# Patient Record
Sex: Male | Born: 1985 | Race: White | Hispanic: No | Marital: Single | Smoking: Never smoker
Health system: Southern US, Community
[De-identification: ages and names within clinical notes are randomized; demographics above are authoritative.]

---
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
## 2005-12-18 ENCOUNTER — Emergency Department (HOSPITAL_COMMUNITY): Admission: EM | Admit: 2005-12-18 | Discharge: 2005-12-18 | Payer: Self-pay | Admitting: Family Medicine

## 2016-07-24 DIAGNOSIS — Z0000 Encounter for general adult medical examination without abnormal findings: Secondary | ICD-10-CM | POA: Diagnosis not present

## 2016-12-09 DIAGNOSIS — L649 Androgenic alopecia, unspecified: Secondary | ICD-10-CM | POA: Diagnosis not present

## 2017-02-19 DIAGNOSIS — L649 Androgenic alopecia, unspecified: Secondary | ICD-10-CM | POA: Diagnosis not present

## 2017-07-06 DIAGNOSIS — Z01818 Encounter for other preprocedural examination: Secondary | ICD-10-CM | POA: Diagnosis not present

## 2017-09-16 DIAGNOSIS — J310 Chronic rhinitis: Secondary | ICD-10-CM | POA: Diagnosis not present

## 2017-09-16 DIAGNOSIS — J343 Hypertrophy of nasal turbinates: Secondary | ICD-10-CM | POA: Diagnosis not present

## 2018-03-06 DIAGNOSIS — S43015A Anterior dislocation of left humerus, initial encounter: Secondary | ICD-10-CM | POA: Diagnosis not present

## 2018-03-06 DIAGNOSIS — M25519 Pain in unspecified shoulder: Secondary | ICD-10-CM | POA: Diagnosis not present

## 2018-03-06 DIAGNOSIS — R52 Pain, unspecified: Secondary | ICD-10-CM | POA: Diagnosis not present

## 2018-03-07 ENCOUNTER — Emergency Department (HOSPITAL_BASED_OUTPATIENT_CLINIC_OR_DEPARTMENT_OTHER): Payer: 59

## 2018-03-07 ENCOUNTER — Encounter (HOSPITAL_BASED_OUTPATIENT_CLINIC_OR_DEPARTMENT_OTHER): Payer: Self-pay | Admitting: *Deleted

## 2018-03-07 ENCOUNTER — Emergency Department (HOSPITAL_BASED_OUTPATIENT_CLINIC_OR_DEPARTMENT_OTHER)
Admission: EM | Admit: 2018-03-07 | Discharge: 2018-03-07 | Disposition: A | Discharge status: Home / Self Care | Payer: 59 | Attending: Emergency Medicine | Admitting: Emergency Medicine

## 2018-03-07 ENCOUNTER — Other Ambulatory Visit: Payer: Self-pay

## 2018-03-07 DIAGNOSIS — W1839XA Other fall on same level, initial encounter: Secondary | ICD-10-CM | POA: Diagnosis not present

## 2018-03-07 DIAGNOSIS — S43015A Anterior dislocation of left humerus, initial encounter: Secondary | ICD-10-CM | POA: Insufficient documentation

## 2018-03-07 DIAGNOSIS — Y999 Unspecified external cause status: Secondary | ICD-10-CM | POA: Diagnosis not present

## 2018-03-07 DIAGNOSIS — Y929 Unspecified place or not applicable: Secondary | ICD-10-CM | POA: Diagnosis not present

## 2018-03-07 DIAGNOSIS — S43005A Unspecified dislocation of left shoulder joint, initial encounter: Secondary | ICD-10-CM | POA: Diagnosis not present

## 2018-03-07 DIAGNOSIS — Y9322 Activity, ice hockey: Secondary | ICD-10-CM | POA: Insufficient documentation

## 2018-03-07 DIAGNOSIS — S4992XA Unspecified injury of left shoulder and upper arm, initial encounter: Secondary | ICD-10-CM | POA: Diagnosis present

## 2018-03-07 DIAGNOSIS — S4292XA Fracture of left shoulder girdle, part unspecified, initial encounter for closed fracture: Secondary | ICD-10-CM | POA: Insufficient documentation

## 2018-03-07 DIAGNOSIS — T148XXA Other injury of unspecified body region, initial encounter: Secondary | ICD-10-CM

## 2018-03-07 MED ORDER — KETAMINE HCL 10 MG/ML IJ SOLN
60.0000 mg | Freq: Once | INTRAMUSCULAR | Status: AC
  Administered 2018-03-07: 40 mg via INTRAVENOUS
  Filled 2018-03-07: qty 1

## 2018-03-07 MED ORDER — PROPOFOL 10 MG/ML IV BOLUS
INTRAVENOUS | Status: AC | PRN
  Administered 2018-03-07: 20 mg via INTRAVENOUS

## 2018-03-07 MED ORDER — PROPOFOL 10 MG/ML IV BOLUS
40.0000 mg | Freq: Once | INTRAVENOUS | Status: AC
Start: 2018-03-07 — End: 2018-03-07
  Administered 2018-03-07: 40 mg via INTRAVENOUS
  Filled 2018-03-07: qty 20

## 2018-03-07 MED ORDER — ONDANSETRON HCL 4 MG/2ML IJ SOLN
INTRAMUSCULAR | Status: AC
  Administered 2018-03-07: 02:00:00
  Filled 2018-03-07: qty 2

## 2018-03-07 MED ORDER — SODIUM CHLORIDE 0.9 % IV BOLUS
1000.0000 mL | Freq: Once | INTRAVENOUS | Status: AC
  Administered 2018-03-07: 1000 mL via INTRAVENOUS

## 2018-03-07 MED ORDER — HYDROMORPHONE HCL 1 MG/ML IJ SOLN
1.0000 mg | Freq: Once | INTRAMUSCULAR | Status: AC
  Administered 2018-03-07: 1 mg via INTRAVENOUS
  Filled 2018-03-07: qty 1

## 2018-03-07 NOTE — ED Triage Notes (Addendum)
Pt states that he fell while playing hockey and landed on his left shoulder. History of left shoulder dislocation last week that he was able to reduce himself. Pt arrived via [facility]. Denies any other injury. Pt rates pain 8/10. Pt received of fentanyl and 4mg  zofran. Pt unable to move his left shoulder due to pain. Ice pack given.

## 2018-03-07 NOTE — ED Notes (Signed)
Patient transported to X-ray 

## 2018-03-07 NOTE — Sedation Documentation (Signed)
Pt continues to moan despite medication. V/O given by Dr. Eudelia Bunch for 20 mg of Propofol.

## 2018-03-07 NOTE — ED Provider Notes (Addendum)
[facility] EMERGENCY DEPARTMENT Provider Note  CSN: [id number] Arrival date & time: 03/07/18 9562  Chief Complaint(s) left shoulder injury  HPI Kevin Booker is a 32 y.o. male who presents to the emergency department with left shoulder pain.  He reports that he was playing hockey several hours ago and fell onto his left shoulder.  Patient felt immediate pain that was exacerbated with range of motion of the shoulder.  Alleviated by immobility.  States that this feels like it is dislocated shoulder similar to one he had last week which she self reduced.  Reports 2 prior dislocations several years ago.  Denies any other trauma or injuries.  Denies any numbness or tingling.  No neck pain or back pain.  No chest pain, abdominal pain.  No hip pain or lower extremity pain.  HPI  Past Medical History History reviewed. No pertinent past medical history. There are no active problems to display for this patient.  Home Medication(s) Prior to Admission medications   Medication Sig Start Date End Date Taking? Authorizing Provider  Venlafaxine HCl (EFFEXOR PO) Take by mouth.   Yes [provider]                                                                                                                                    Past Surgical History History reviewed. No pertinent surgical history. Family History History reviewed. No pertinent family history.  Social History Social History   Tobacco Use  . Smoking status: Never Smoker  . Smokeless tobacco: Never Used  Substance Use Topics  . Alcohol use: Not Currently    Frequency: Never  . Drug use: Never   Allergies Sulfa antibiotics  Review of Systems Review of Systems All other systems are reviewed and are negative for acute change except as noted in the HPI  Physical Exam Vital Signs  I have reviewed the triage vital signs BP 127/85 (BP Location: Right Arm)   Pulse 63   Temp 98.3 F (36.8 C)   Resp 17   SpO2  100%   Physical Exam  Constitutional: He is oriented to person, place, and time. He appears well-developed and well-nourished. No distress.  HENT:  Head: Normocephalic and atraumatic.  Right Ear: External ear normal.  Left Ear: External ear normal.  Nose: Nose normal.  Mouth/Throat: Mucous membranes are normal. No trismus in the jaw.  Eyes: Conjunctivae and EOM are normal. No scleral icterus.  Neck: Normal range of motion and phonation normal. No spinous process tenderness and no muscular tenderness present. Normal range of motion present.  Cardiovascular: Normal rate and regular rhythm.  Pulmonary/Chest: Effort normal. No stridor. No respiratory distress.  Abdominal: He exhibits no distension.  Musculoskeletal: He exhibits no edema.       Left shoulder: He exhibits decreased range of motion, tenderness and deformity. He exhibits normal pulse and normal strength.  Neurological: He is alert and oriented to  person, place, and time.  Skin: He is not diaphoretic.  Psychiatric: He has a normal mood and affect. His behavior is normal.  Vitals reviewed.   ED Results and Treatments Labs (all labs ordered are listed, but only abnormal results are displayed) Labs Reviewed - No data to display                                                                                                                       EKG  EKG Interpretation  Date/Time:    Ventricular Rate:    PR Interval:    QRS Duration:   QT Interval:    QTC Calculation:   R Axis:     Text Interpretation:        Radiology Dg Shoulder Left  Result Date: 03/07/2018 CLINICAL DATA:  32 year old male with fall and left shoulder dislocation. EXAM: LEFT SHOULDER - 2+ VIEW COMPARISON:  None. FINDINGS: There is anterior dislocation of the left shoulder. A 5 mm radiodense focus medial to the humeral head may be artifactual or represent a tiny bone fragment. The soft tissues are unremarkable. IMPRESSION: Anterior dislocation of the  left shoulder. Electronically Signed   By: Elgie Collard M.D.   On: 03/07/2018 01:53   Dg Shoulder Left Portable  Result Date: 03/07/2018 CLINICAL DATA:  Reduction of anterior dislocation EXAM: LEFT SHOULDER-2 VIEW COMPARISON:  March 07, 2018 study obtained earlier in the day FINDINGS: Frontal and Y scapular images were obtained. There has been successful reduction of anterior dislocation. Currently no dislocation evident. There is an old healed fracture of the left clavicle. A small calcification is noted inferior to the glenoid, likely a small avulsed bony fragment. No new evident fracture. IMPRESSION: Successful reduction of anterior dislocation. Small bony fragment located inferior to the glenoid, likely an avulsion of uncertain origin. Old healed fracture left clavicle with remodeling. Electronically Signed   By: Bretta Bang III M.D.   On: 03/07/2018 02:53   Pertinent labs & imaging results that were available during my care of the patient were reviewed by me and considered in my medical decision making (see chart for details).  Medications Ordered in ED Medications  ondansetron (ZOFRAN) 4 MG/2ML injection (has no administration in time range)  HYDROmorphone (DILAUDID) injection 1 mg (1 mg Intravenous Given 03/07/18 0135)  ketamine (KETALAR) injection 60 mg (40 mg Intravenous Given by Other 03/07/18 0228)  propofol (DIPRIVAN) 10 mg/mL bolus/IV push 40 mg (40 mg Intravenous Given by Other 03/07/18 0229)  sodium chloride 0.9 % bolus 1,000 mL (1,000 mLs Intravenous New Bag/Given 03/07/18 0135)  propofol (DIPRIVAN) 10 mg/mL bolus/IV push (20 mg Intravenous Given 03/07/18 0231)  Procedures .Sedation Date/Time: 03/07/2018 3:15 AM Performed by: Nira Conn, MD Authorized by: Nira Conn, MD   Consent:    Consent obtained:  Verbal and  written   Consent given by:  Patient   Risks discussed:  Allergic reaction, dysrhythmia, inadequate sedation, nausea, prolonged hypoxia resulting in organ damage, prolonged sedation necessitating reversal, respiratory compromise necessitating ventilatory assistance and intubation and vomiting   Alternatives discussed:  Analgesia without sedation, anxiolysis and regional anesthesia Universal protocol:    Procedure explained and questions answered to patient or proxy's satisfaction: yes     Relevant documents present and verified: yes     Test results available and properly labeled: yes     Imaging studies available: yes     Required blood products, implants, devices, and special equipment available: yes     Site/side marked: yes     Immediately prior to procedure a time out was called: yes     Patient identity confirmation method:  Verbally with patient Indications:    Procedure necessitating sedation performed by:  Physician performing sedation Pre-sedation assessment:    Time since last food or drink:  Last food was several hours. drank a sip of water less than 1 hour ago   ASA classification: class 1 - normal, healthy patient     Neck mobility: normal     Mouth opening:  3 or more finger widths   Thyromental distance:  4 finger widths   Mallampati score:  I - soft palate, uvula, fauces, pillars visible   Pre-sedation assessments completed and reviewed: airway patency, cardiovascular function, hydration status, mental status, nausea/vomiting, pain level, respiratory function and temperature     Pre-sedation assessment completed:  03/07/2018 2:00 AM Immediate pre-procedure details:    Reassessment: Patient reassessed immediately prior to procedure     Reviewed: vital signs, relevant labs/tests and NPO status     Verified: bag valve mask available, emergency equipment available, intubation equipment available, IV patency confirmed, oxygen available and suction available   Procedure details  (see MAR for exact dosages):    Preoxygenation:  Nasal cannula   Sedation:  Propofol   Intra-procedure monitoring:  Blood pressure monitoring, cardiac monitor, continuous pulse oximetry, frequent LOC assessments, frequent vital sign checks and continuous capnometry   Intra-procedure events: none     Total Provider sedation time (minutes):  5 Post-procedure details:    Post-sedation assessment completed:  03/07/2018 3:16 AM   Attendance: Constant attendance by certified staff until patient recovered     Recovery: Patient returned to pre-procedure baseline     Post-sedation assessments completed and reviewed: airway patency, cardiovascular function, hydration status, mental status, nausea/vomiting, pain level, respiratory function and temperature     Patient is stable for discharge or admission: yes     Patient tolerance:  Tolerated well, no immediate complications .Ortho Injury Treatment Date/Time: 03/07/2018 3:16 AM Performed by: Nira Conn, MD Authorized by: Nira Conn, MD   Consent:    Consent obtained:  Written   Consent given by:  Patient   Risks discussed:  Fracture, irreducible dislocation and recurrent dislocation   Alternatives discussed:  ImmobilizationInjury location: shoulder Location details: left shoulder Injury type: dislocation Dislocation type: anterior Hill-Sachs deformity: no Chronicity: recurrent Pre-procedure neurovascular assessment: neurovascularly intact  Patient sedated: Yes. Refer to sedation procedure documentation for details of sedation. Manipulation performed: yes Reduction method: external rotation Reduction successful: yes X-ray confirmed reduction: yes Immobilization: sling Post-procedure neurovascular assessment: post-procedure neurovascularly intact Patient tolerance: Patient tolerated the  procedure well with no immediate complications     (including critical care time)  Medical Decision Making / ED Course I have  reviewed the nursing notes for this encounter and the patient's prior records (if available in EHR or on provided paperwork).    Patient presents with left shoulder dislocation.  No other injuries noted on exam requiring imaging.  Neurovascular intact distally.  Shoulder dislocation was reduced successfully as above.  The patient appears reasonably screened and/or stabilized for discharge and I doubt any other medical condition or other [facility] requiring further screening, evaluation, or treatment in the ED at this time prior to discharge.  The patient is safe for discharge with strict return precautions.   Final Clinical Impression(s) / ED Diagnoses Final diagnoses:  Traumatic closed displaced fracture of left shoulder with anterior dislocation, initial encounter  Avulsion fracture    Disposition: Discharge  Condition: Good  I have discussed the results, Dx and Tx plan with the patient who expressed understanding and agree(s) with the plan. Discharge instructions discussed at great length. The patient was given strict return precautions who verbalized understanding of the instructions. No further questions at time of discharge.    ED Discharge Orders    None       Follow Up: Kathryne Hitch, MD [address] [place] [address] [phone]  Schedule an appointment as soon as possible for a visit  For close follow up to assess for shoulder dislocation with avulsion glenoid fracture     This chart was dictated using voice recognition software.  Despite best efforts to proofread,  errors can occur which can change the documentation meaning.     Nira Conn, MD 03/07/18 [phone]

## 2018-03-07 NOTE — ED Notes (Signed)
Sedation end. Pt stable, awake and talking, able to move all extremities.

## 2018-03-07 NOTE — Sedation Documentation (Signed)
Sedation end charted in error. Sedation monitoring continued. Pt awake and talking, procedure completed.

## 2018-06-16 DIAGNOSIS — L649 Androgenic alopecia, unspecified: Secondary | ICD-10-CM | POA: Diagnosis not present

## 2020-01-12 DIAGNOSIS — Z0000 Encounter for general adult medical examination without abnormal findings: Secondary | ICD-10-CM | POA: Diagnosis not present

## 2020-01-12 DIAGNOSIS — Z13220 Encounter for screening for lipoid disorders: Secondary | ICD-10-CM | POA: Diagnosis not present

## 2020-01-12 DIAGNOSIS — Z23 Encounter for immunization: Secondary | ICD-10-CM | POA: Diagnosis not present

## 2020-01-16 IMAGING — DX DG SHOULDER 1V*L*
2 series · 2 of 2 positions shown · non-contrast
Comparison: March 07, 2018 study obtained earlier in the day

CLINICAL DATA: Reduction of anterior dislocation

EXAM:
LEFT [id number] VIEW

[shoulder grashey]
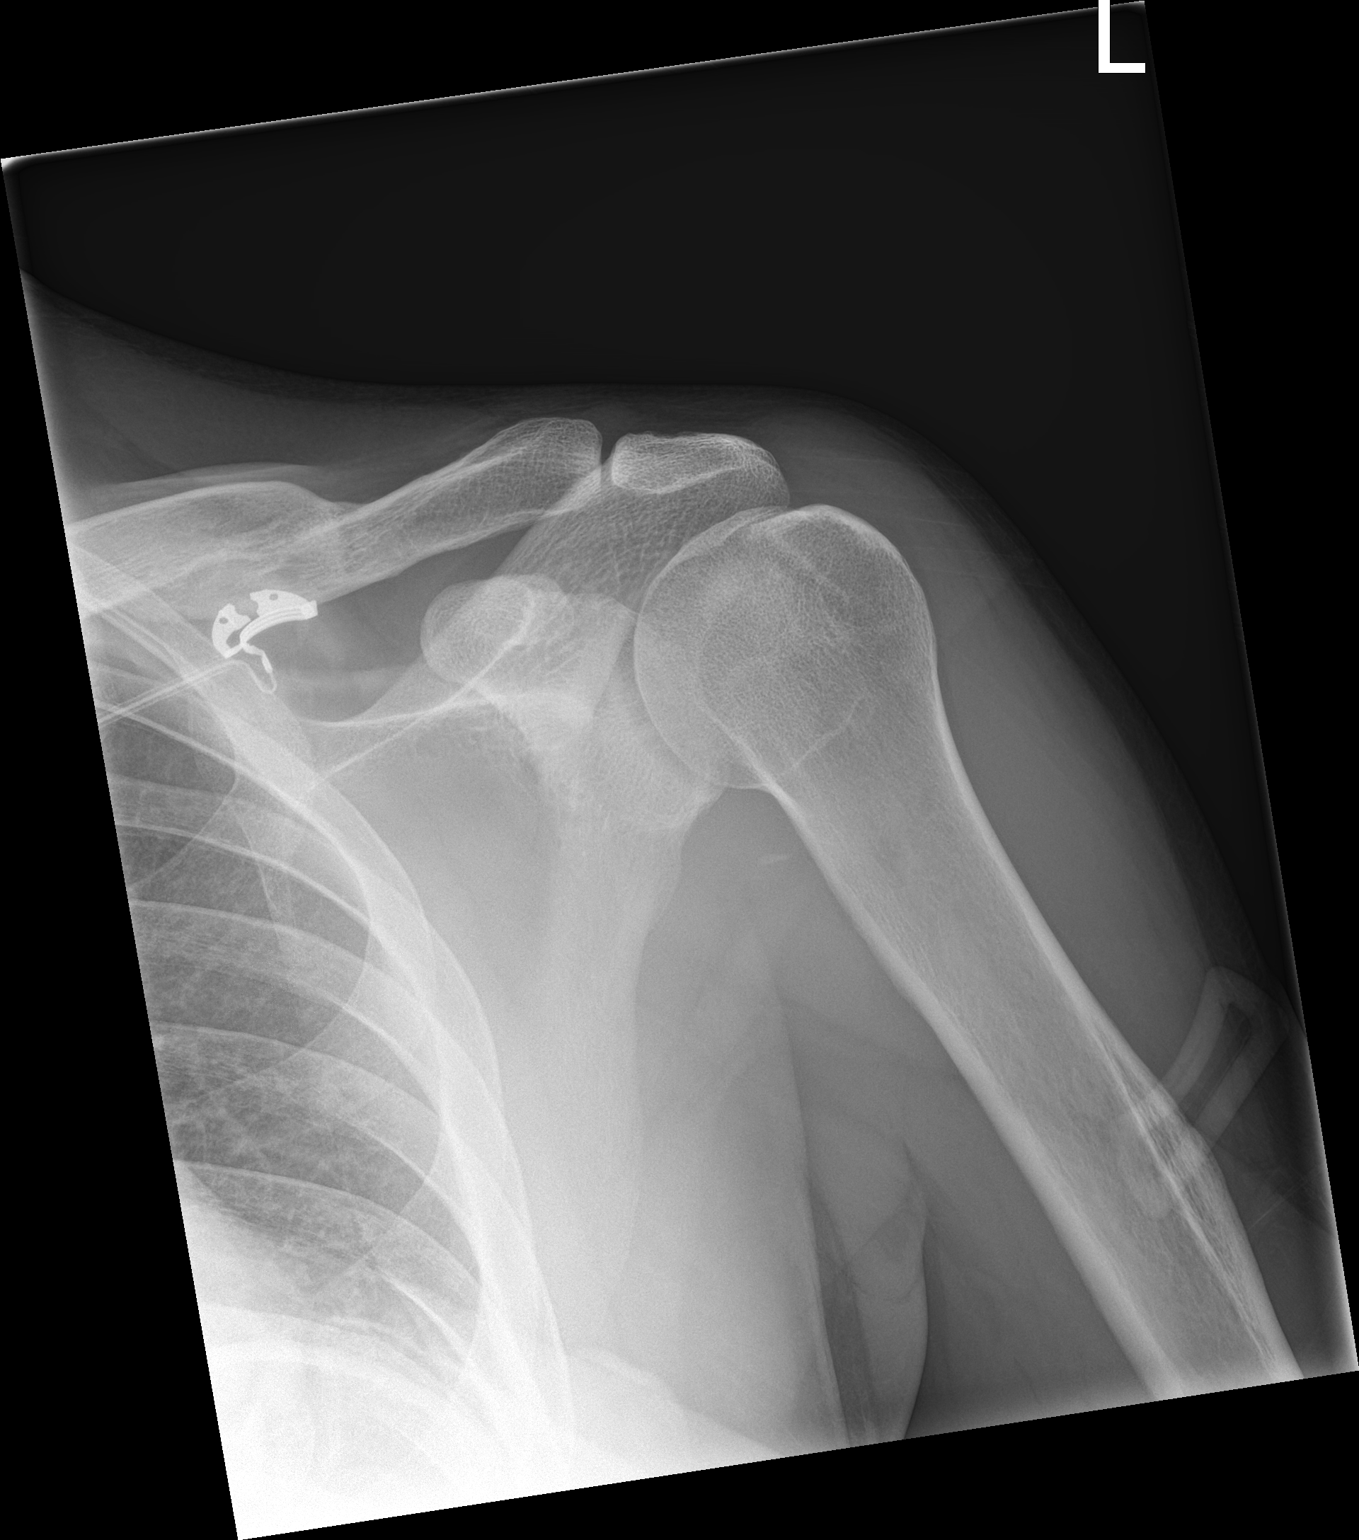

[shoulder y-view]
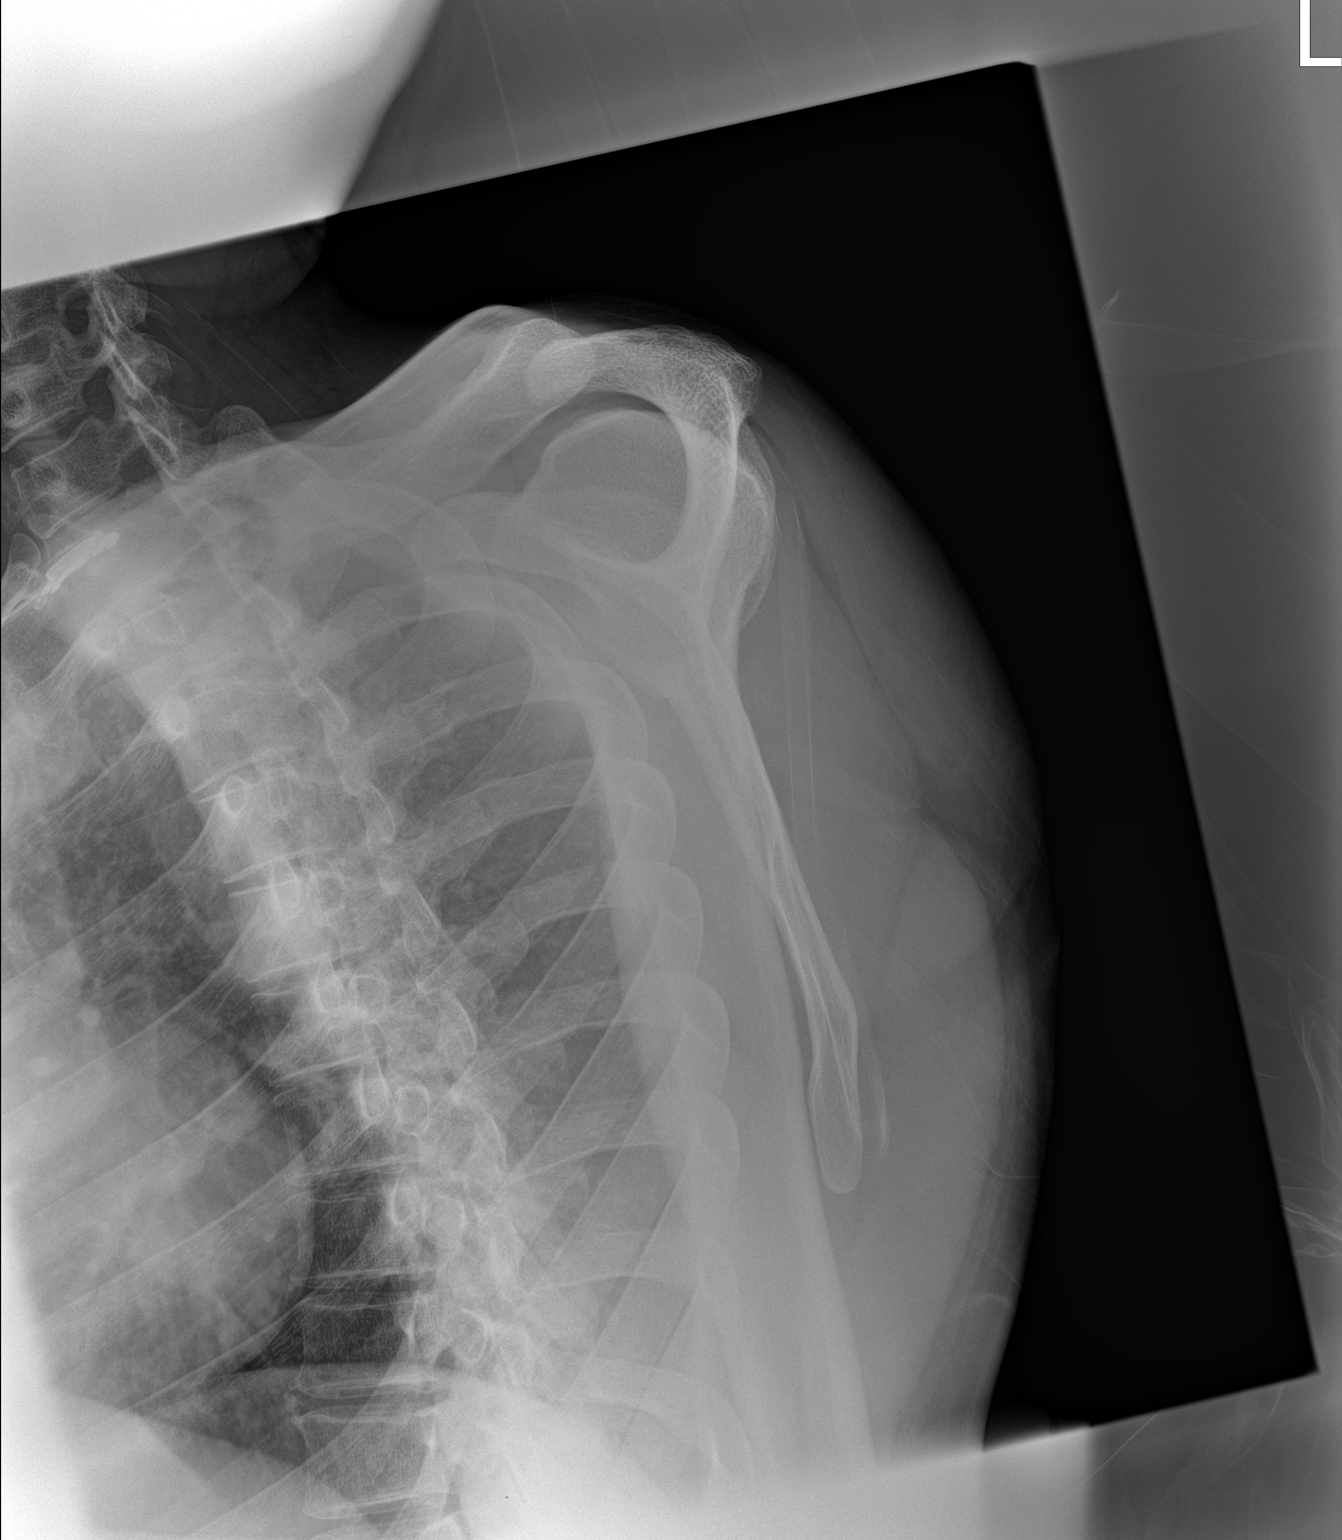

[2 of 2 positions shown; findings below may reference images not displayed]

FINDINGS: Frontal and Y scapular images were obtained. There has been
successful reduction of anterior dislocation. Currently no
dislocation evident. There is an old healed fracture of the left
clavicle. A small calcification is noted inferior to the glenoid,
likely a small avulsed bony fragment. No new evident fracture.
IMPRESSION: Successful reduction of anterior dislocation. Small bony fragment
located inferior to the glenoid, likely an avulsion of uncertain
origin. Old healed fracture left clavicle with remodeling.

## 2020-01-16 IMAGING — DX DG SHOULDER 2+V*L*
2 series · 2 of 2 positions shown · non-contrast
Comparison: None.

CLINICAL DATA: 31-year-old male with fall and left shoulder
dislocation.

EXAM:
LEFT SHOULDER - 2+ VIEW

[shoulder grashey]
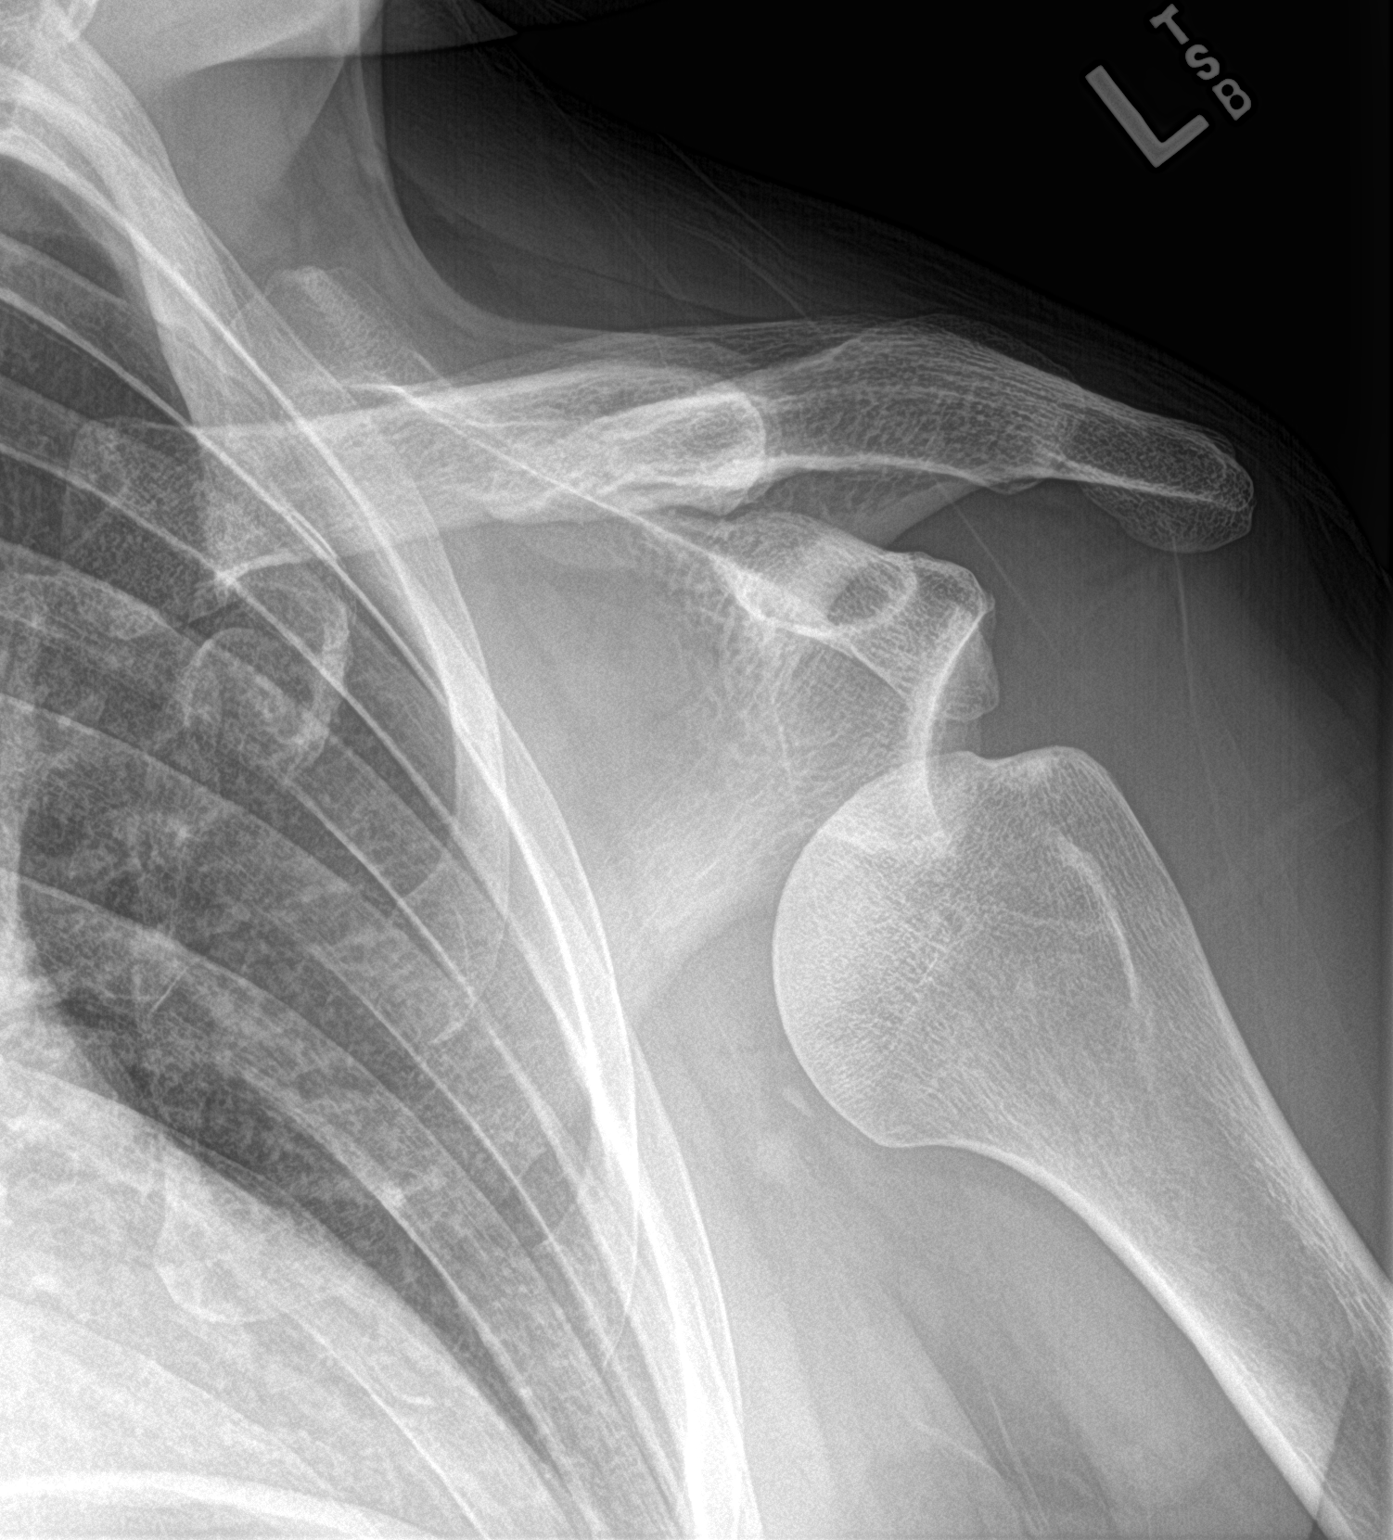

[shoulder y view]
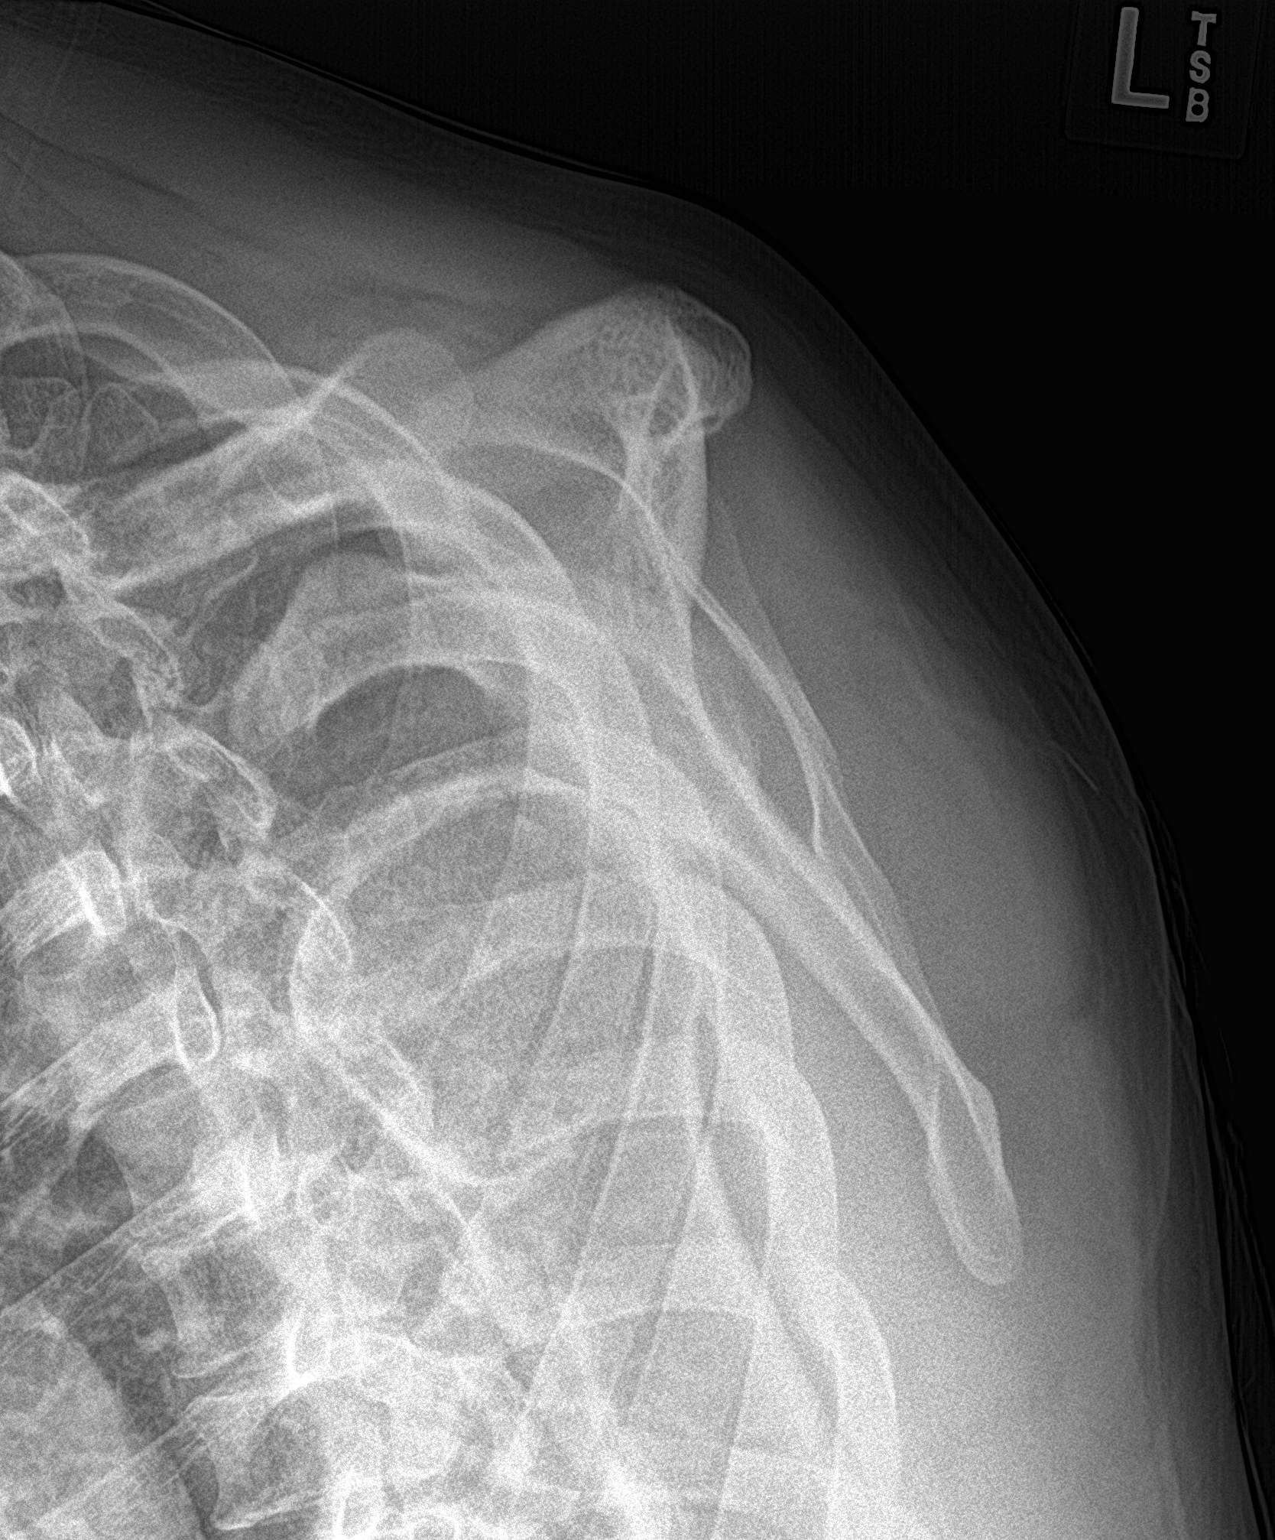

[2 of 2 positions shown; findings below may reference images not displayed]

FINDINGS: There is anterior dislocation of the left shoulder. A 5 mm
radiodense focus medial to the humeral head may be artifactual or
represent a tiny bone fragment. The soft tissues are unremarkable.
IMPRESSION: Anterior dislocation of the left shoulder.
